# Patient Record
Sex: Male | Born: 1980 | Race: White | Hispanic: No | Marital: Single | Smoking: Never smoker
Health system: Southern US, Community
[De-identification: ages and names within clinical notes are randomized; demographics above are authoritative.]

## PROBLEM LIST (undated history)

## (undated) DIAGNOSIS — N289 Disorder of kidney and ureter, unspecified: Secondary | ICD-10-CM

---
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
## 2002-01-16 ENCOUNTER — Emergency Department (HOSPITAL_COMMUNITY): Admission: EM | Admit: 2002-01-16 | Discharge: 2002-01-16 | Payer: Self-pay | Admitting: Emergency Medicine

## 2004-05-21 ENCOUNTER — Emergency Department (HOSPITAL_COMMUNITY): Admission: EM | Admit: 2004-05-21 | Discharge: 2004-05-21 | Payer: Self-pay | Admitting: Family Medicine

## 2005-01-01 ENCOUNTER — Emergency Department (HOSPITAL_COMMUNITY): Admission: EM | Admit: 2005-01-01 | Discharge: 2005-01-01 | Payer: Self-pay | Admitting: Family Medicine

## 2005-11-11 ENCOUNTER — Emergency Department (HOSPITAL_COMMUNITY): Admission: EM | Admit: 2005-11-11 | Discharge: 2005-11-11 | Payer: Self-pay | Admitting: Family Medicine

## 2006-12-21 ENCOUNTER — Emergency Department (HOSPITAL_COMMUNITY): Admission: EM | Admit: 2006-12-21 | Discharge: 2006-12-21 | Payer: Self-pay | Admitting: Emergency Medicine

## 2007-05-27 ENCOUNTER — Emergency Department (HOSPITAL_COMMUNITY): Admission: EM | Admit: 2007-05-27 | Discharge: 2007-05-27 | Payer: Self-pay | Admitting: Emergency Medicine

## 2007-12-24 ENCOUNTER — Emergency Department (HOSPITAL_COMMUNITY): Admission: EM | Admit: 2007-12-24 | Discharge: 2007-12-24 | Payer: Self-pay | Admitting: Emergency Medicine

## 2010-12-08 LAB — CBC
HCT: 43.7
Hemoglobin: 15.2
MCHC: 34.8
RBC: 4.9
RDW: 13.2

## 2010-12-08 LAB — URINALYSIS, ROUTINE W REFLEX MICROSCOPIC
Bilirubin Urine: NEGATIVE
Ketones, ur: 15 — AB
Nitrite: NEGATIVE
Protein, ur: 30 — AB
pH: 5.5

## 2010-12-08 LAB — COMPREHENSIVE METABOLIC PANEL
ALT: 25
Alkaline Phosphatase: 81
BUN: 11
CO2: 21
Calcium: 9.6
GFR calc non Af Amer: >60
Glucose, Bld: 122 — ABNORMAL HIGH
Potassium: 4.1
Sodium: 136

## 2010-12-08 LAB — DIFFERENTIAL
Basophils Relative: 0
Eosinophils Absolute: 0
Neutro Abs: 16 — ABNORMAL HIGH
Neutrophils Relative %: 89 — ABNORMAL HIGH

## 2010-12-08 LAB — URINE MICROSCOPIC-ADD ON

## 2010-12-08 LAB — LIPASE, BLOOD: Lipase: 20

## 2010-12-15 LAB — POCT RAPID STREP A: Streptococcus, Group A Screen (Direct): NEGATIVE

## 2019-06-02 ENCOUNTER — Encounter (HOSPITAL_COMMUNITY): Payer: Self-pay

## 2019-06-02 ENCOUNTER — Emergency Department (HOSPITAL_COMMUNITY): Payer: 59

## 2019-06-02 ENCOUNTER — Other Ambulatory Visit: Payer: Self-pay

## 2019-06-02 ENCOUNTER — Emergency Department (HOSPITAL_COMMUNITY)
Admission: EM | Admit: 2019-06-02 | Discharge: 2019-06-02 | Disposition: A | Discharge status: Home / Self Care | Payer: 59 | Attending: Emergency Medicine | Admitting: Emergency Medicine

## 2019-06-02 DIAGNOSIS — N200 Calculus of kidney: Secondary | ICD-10-CM | POA: Diagnosis not present

## 2019-06-02 DIAGNOSIS — R1032 Left lower quadrant pain: Secondary | ICD-10-CM | POA: Diagnosis present

## 2019-06-02 DIAGNOSIS — M25572 Pain in left ankle and joints of left foot: Secondary | ICD-10-CM | POA: Diagnosis not present

## 2019-06-02 DIAGNOSIS — R109 Unspecified abdominal pain: Secondary | ICD-10-CM

## 2019-06-02 DIAGNOSIS — N201 Calculus of ureter: Secondary | ICD-10-CM | POA: Insufficient documentation

## 2019-06-02 DIAGNOSIS — S99919A Unspecified injury of unspecified ankle, initial encounter: Secondary | ICD-10-CM

## 2019-06-02 HISTORY — DX: Disorder of kidney and ureter, unspecified: N28.9

## 2019-06-02 LAB — CBC WITH DIFFERENTIAL/PLATELET
Abs Immature Granulocytes: 0.08 10*3/uL — ABNORMAL HIGH (ref 0.00–0.07)
Basophils Absolute: 0.1 10*3/uL (ref 0.0–0.1)
Basophils Relative: 0 %
Eosinophils Absolute: 0.1 10*3/uL (ref 0.0–0.5)
Eosinophils Relative: 0 %
HCT: 46.7 % (ref 39.0–52.0)
Hemoglobin: 15.4 g/dL (ref 13.0–17.0)
Immature Granulocytes: 1 %
Lymphocytes Relative: 14 %
Lymphs Abs: 2.5 10*3/uL (ref 0.7–4.0)
MCH: 30.4 pg (ref 26.0–34.0)
MCHC: 33 g/dL (ref 30.0–36.0)
MCV: 92.3 fL (ref 80.0–100.0)
Monocytes Absolute: 0.9 10*3/uL (ref 0.1–1.0)
Monocytes Relative: 5 %
Neutro Abs: 13.7 10*3/uL — ABNORMAL HIGH (ref 1.7–7.7)
Neutrophils Relative %: 80 %
Platelets: 279 10*3/uL (ref 150–400)
RBC: 5.06 MIL/uL (ref 4.22–5.81)
RDW: 13.6 % (ref 11.5–15.5)
WBC: 17.3 10*3/uL — ABNORMAL HIGH (ref 4.0–10.5)
nRBC: 0 % (ref 0.0–0.2)

## 2019-06-02 LAB — URINALYSIS, ROUTINE W REFLEX MICROSCOPIC
Bilirubin Urine: NEGATIVE
Glucose, UA: NEGATIVE mg/dL
Ketones, ur: 20 mg/dL — AB
Leukocytes,Ua: NEGATIVE
Nitrite: NEGATIVE
Protein, ur: NEGATIVE mg/dL
RBC / HPF: >50 RBC/hpf — ABNORMAL HIGH (ref 0–5)
Specific Gravity, Urine: 1.012 (ref 1.005–1.030)
pH: 6 (ref 5.0–8.0)

## 2019-06-02 LAB — COMPREHENSIVE METABOLIC PANEL
ALT: 39 U/L (ref 0–44)
AST: 23 U/L (ref 15–41)
Albumin: 4.3 g/dL (ref 3.5–5.0)
Alkaline Phosphatase: 89 U/L (ref 38–126)
Anion gap: 8 (ref 5–15)
BUN: 14 mg/dL (ref 6–20)
CO2: 24 mmol/L (ref 22–32)
Calcium: 9.2 mg/dL (ref 8.9–10.3)
Chloride: 104 mmol/L (ref 98–111)
Creatinine, Ser: 1.19 mg/dL (ref 0.61–1.24)
GFR calc Af Amer: >60 mL/min (ref >60)
GFR calc non Af Amer: >60 mL/min (ref >60)
Glucose, Bld: 118 mg/dL — ABNORMAL HIGH (ref 70–99)
Potassium: 3.7 mmol/L (ref 3.5–5.1)
Sodium: 136 mmol/L (ref 135–145)
Total Bilirubin: 0.8 mg/dL (ref 0.3–1.2)
Total Protein: 8.1 g/dL (ref 6.5–8.1)

## 2019-06-02 MED ORDER — TAMSULOSIN HCL 0.4 MG PO CAPS: 0.4000 mg | ORAL_CAPSULE | Freq: Every day | ORAL | 0 refills | Status: AC

## 2019-06-02 MED ORDER — ONDANSETRON HCL 4 MG/2ML IJ SOLN
4.0000 mg | Freq: Once | INTRAMUSCULAR | Status: AC
  Administered 2019-06-02: 4 mg via INTRAVENOUS
  Filled 2019-06-02: qty 2

## 2019-06-02 MED ORDER — KETOROLAC TROMETHAMINE 15 MG/ML IJ SOLN
15.0000 mg | Freq: Once | INTRAMUSCULAR | Status: AC
  Administered 2019-06-02: 15 mg via INTRAVENOUS
  Filled 2019-06-02: qty 1

## 2019-06-02 MED ORDER — MORPHINE SULFATE (PF) 4 MG/ML IV SOLN
4.0000 mg | Freq: Once | INTRAVENOUS | Status: AC
  Administered 2019-06-02: 18:00:00 4 mg via INTRAVENOUS
  Filled 2019-06-02: qty 1

## 2019-06-02 NOTE — Discharge Instructions (Addendum)
I have prescribed you a medication called tamsulosin which is better known as Flomax.  Please take this medication once a day.  This will increase your urinary frequency but can also aid in helping you pass this kidney stone.  Additionally, please take an anti-inflammatory such as ibuprofen for your pain.  You can take 800 mg every 8 hours for your pain.

## 2019-06-02 NOTE — ED Provider Notes (Signed)
[facility]-EMERGENCY DEPT Provider Note   CSN: [id number] Arrival date & time: 06/02/19  1634     History Chief Complaint  Patient presents with  . Flank Pain  . Urinary Frequency  . Ankle Pain    Randy Dennis is a 39 y.o. male.  HPI HPI Comments: Randy Dennis is a 39 y.o. male with a history of obesity who presents to the Emergency Department complaining of worsening left flank pain which began last night.  Patient states that he has a history of kidney stones with his last occurrence being about 10 years ago.  He states his current pain feels similar to prior kidney stones. He states while working in [employer] last night he began experiencing his left flank pain which has gradually worsened since last night.  He took 2 x OTC Tylenol with no significant relief of his pain.  His current pain is 10 out of 10.  Reports some mild difficulty urinating last night but denies any currently.  He also reports some mild nausea.  Denies any dysuria, hematuria and vomiting.    Past Medical History:  Diagnosis Date  . Renal disorder     There are no problems to display for this patient.   History reviewed. No pertinent surgical history.     Family History  Problem Relation Age of Onset  . Heart failure Father     Social History   Tobacco Use  . Smoking status: Never Smoker  . Smokeless tobacco: Never Used  Substance Use Topics  . Alcohol use: Never  . Drug use: Never    Home Medications Prior to Admission medications   Not on File    Allergies    Patient has no known allergies.  Review of Systems   Review of Systems  Constitutional: Negative for chills and fever.  HENT: Negative for congestion and rhinorrhea.   Respiratory: Positive for shortness of breath.   Cardiovascular: Negative for chest pain.  Gastrointestinal: Positive for abdominal pain and nausea. Negative for diarrhea and vomiting.  Genitourinary: Positive for difficulty  urinating. Negative for dysuria and hematuria.  Musculoskeletal: Positive for back pain.  All other systems reviewed and are negative.  Physical Exam Updated Vital Signs BP 136/83 (BP Location: Left Arm)   Pulse 61   Temp 98 F (36.7 C) (Oral)   Resp 18   Ht 6' (1.829 m)   Wt 115.7 kg   SpO2 98%   BMI 34.58 kg/m   Physical Exam Vitals and nursing note reviewed.  Constitutional:      General: He is in acute distress.     Appearance: Normal appearance. He is obese. He is not ill-appearing, toxic-appearing or diaphoretic.     Comments: Well developed caucasian male lying in semi fowlers position. He is in obvious pain and is diaphoretic during the exam.   HENT:     Head: Normocephalic and atraumatic.     Right Ear: External ear normal.     Left Ear: External ear normal.     Nose: Nose normal.     Mouth/Throat:     Mouth: Mucous membranes are moist.     Pharynx: No oropharyngeal exudate or posterior oropharyngeal erythema.  Eyes:     General: No scleral icterus.       Right eye: No discharge.        Left eye: No discharge.     Extraocular Movements: Extraocular movements intact.     Pupils: Pupils are  equal, round, and reactive to light.  Cardiovascular:     Rate and Rhythm: Normal rate and regular rhythm.     Pulses: Normal pulses.     Heart sounds: Normal heart sounds. No murmur. No friction rub. No gallop.   Pulmonary:     Effort: Pulmonary effort is normal. No respiratory distress.     Breath sounds: Normal breath sounds. No stridor. No wheezing, rhonchi or rales.  Abdominal:     Tenderness: There is abdominal tenderness. There is left CVA tenderness. There is no right CVA tenderness, guarding or rebound.     Comments: Mild left lateral abdominal tenderness with deep palpation  Musculoskeletal:        General: Normal range of motion.     Cervical back: Normal range of motion. No tenderness.  Skin:    General: Skin is warm and dry.  Neurological:     General: No  focal deficit present.     Mental Status: He is alert and oriented to person, place, and time.  Psychiatric:        Mood and Affect: Mood normal.        Behavior: Behavior normal.     ED Results / Procedures / Treatments   Labs (all labs ordered are listed, but only abnormal results are displayed) Labs Reviewed  URINALYSIS, ROUTINE W REFLEX MICROSCOPIC - Abnormal; Notable for the following components:      Result Value   Hgb urine dipstick LARGE (*)    Ketones, ur 20 (*)    RBC / HPF >50 (*)    Bacteria, UA RARE (*)    All other components within normal limits  COMPREHENSIVE METABOLIC PANEL - Abnormal; Notable for the following components:   Glucose, Bld 118 (*)    All other components within normal limits  CBC WITH DIFFERENTIAL/PLATELET - Abnormal; Notable for the following components:   WBC 17.3 (*)    Neutro Abs 13.7 (*)    Abs Immature Granulocytes 0.08 (*)    All other components within normal limits   EKG None  Radiology DG Ankle Complete Left  Result Date: 06/02/2019 CLINICAL DATA:  Left ankle pain after twisting injury 2 months ago. EXAM: LEFT ANKLE COMPLETE - 3+ VIEW COMPARISON:  None. FINDINGS: There is no evidence of fracture, dislocation, or joint effusion. There is no evidence of arthropathy or other focal bone abnormality. Soft tissues are unremarkable. IMPRESSION: Negative. Electronically Signed   By: Lupita Raider M.D.   On: 06/02/2019 17:29    Procedures Procedures (including critical care time)  Medications Ordered in ED Medications  morphine 4 MG/ML injection 4 mg (4 mg Intravenous Given 06/02/19 1818)  ondansetron (ZOFRAN) injection 4 mg (4 mg Intravenous Given 06/02/19 1818)  ketorolac (TORADOL) 15 MG/ML injection 15 mg (15 mg Intravenous Given 06/02/19 1944)    ED Course  I have reviewed the triage vital signs and the nursing notes.  Pertinent labs & imaging results that were available during my care of the patient were reviewed by me and  considered in my medical decision making (see chart for details).    MDM Rules/Calculators/A&P                      5:59 PM patient is a 39 year old male that presents with left flank pain. Differential includes nephrolithiasis vs dissection vs gastroenteritis vs idiopathic. Will obtain basic labs and CT renal stone study. Morphine for pain control. Zofran for nausea. Will reassess.  7:14  PM CT renal stone study shows an obstructing 78mm left distal ureteral calculus. Pt states his current pain is about 7/10. Will give IM toradol. Will reassess based on results of UA.   9:13 PM UA is reassuring.  Discussed results with patient.  Will prescribe him Flomax.  Continued NSAID use recommended for pain control.  His current pain is 1 out of 10. He understand to return with any new or worsening sx. He verbalized understanding of this plan. His questions were answered and he was amicable at the time of discharge. VSS.  Final Clinical Impression(s) / ED Diagnoses Final diagnoses:  Ankle injury  Nephrolithiasis  Flank pain    Rx / DC Orders ED Discharge Orders         Ordered    tamsulosin (FLOMAX) 0.4 MG CAPS capsule  Daily     06/02/19 2108           Rayna Sexton, PA-C 06/02/19 2117    Isla Pence, MD 06/02/19 2122

## 2019-06-02 NOTE — ED Triage Notes (Signed)
Patient c/o left flank pain, urinary frequncy since last night.  Patient also c/o left ankle pain. Patient states he rolled his left ankle a couple of weeks ago.

## 2021-07-25 IMAGING — CT CT RENAL STONE PROTOCOL
2 of 3 series · 16 of 46 positions shown, 18 images · non-contrast
Comparison: May 27, 2007

CLINICAL DATA: Left flank pain. History of kidney stones. Urinary
frequency.

EXAM:
CT ABDOMEN AND PELVIS WITHOUT CONTRAST
TECHNIQUE: Multidetector CT imaging of the abdomen and pelvis was performed
following the standard protocol without IV contrast. Automatic
exposure control utilized.

[Series 4: lung bases · axial · 0.77mm/px · z∈[+1366,+1488]mm · 13 of 71 slices shown, 15 images]
[im 5/71  soft-tissue]
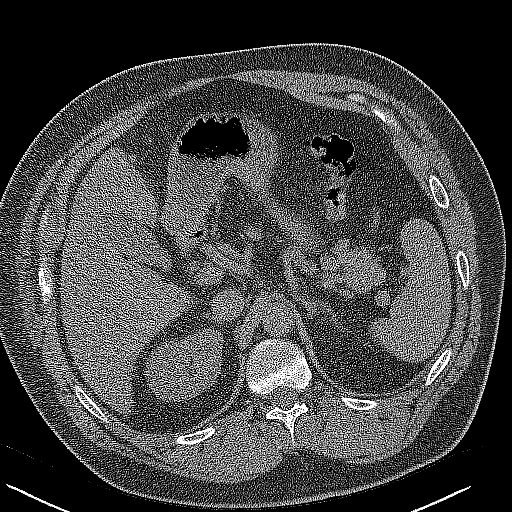
[im 5/71  bone]
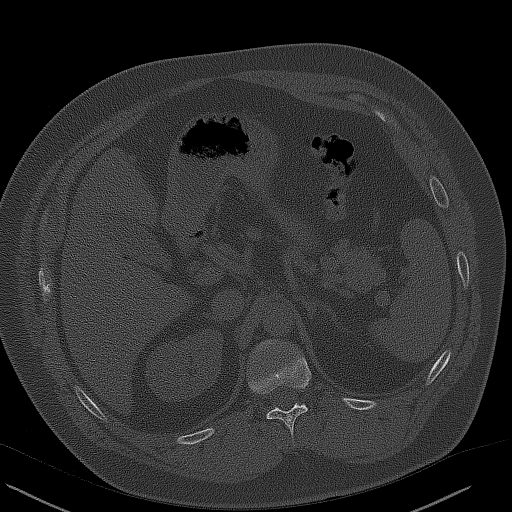
[im 10/71  soft-tissue]
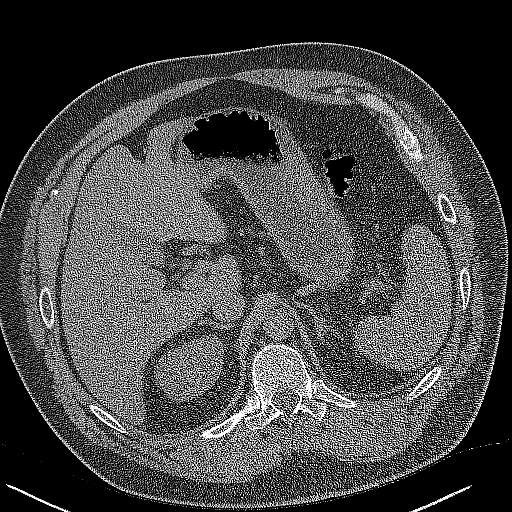
[im 14/71  soft-tissue]
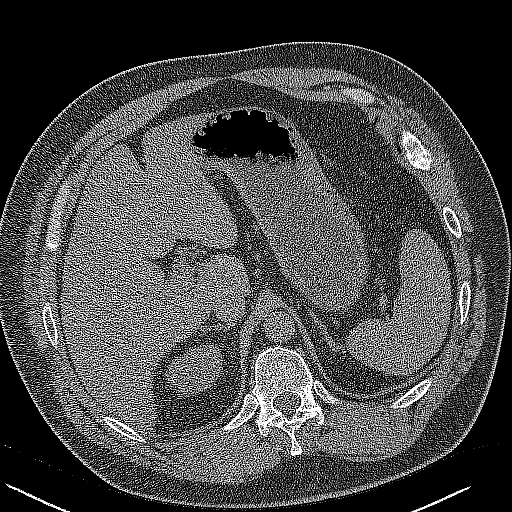
[im 21/71  soft-tissue]
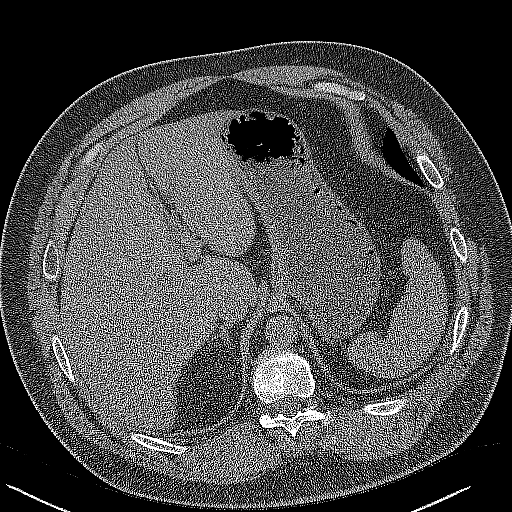
[im 25/71  soft-tissue]
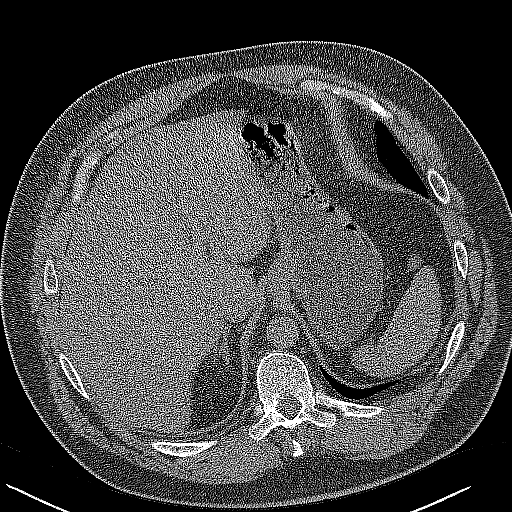
[im 30/71  soft-tissue]
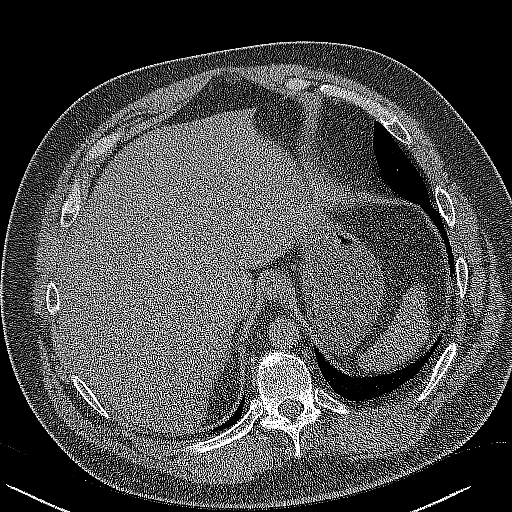
[im 37/71  soft-tissue]
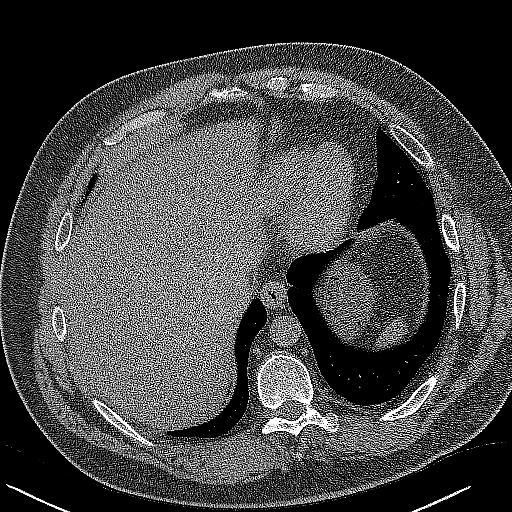
[im 41/71  soft-tissue]
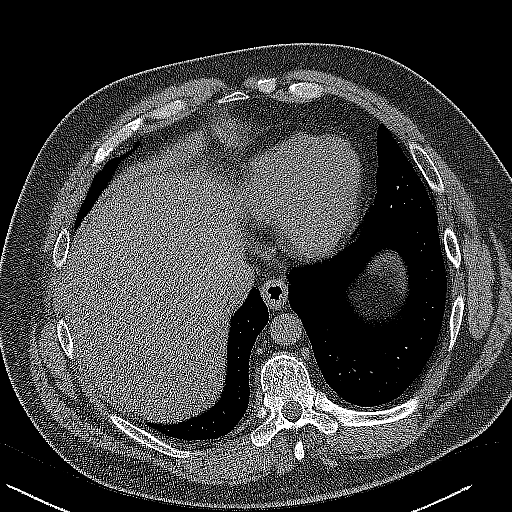
[im 46/71  soft-tissue]
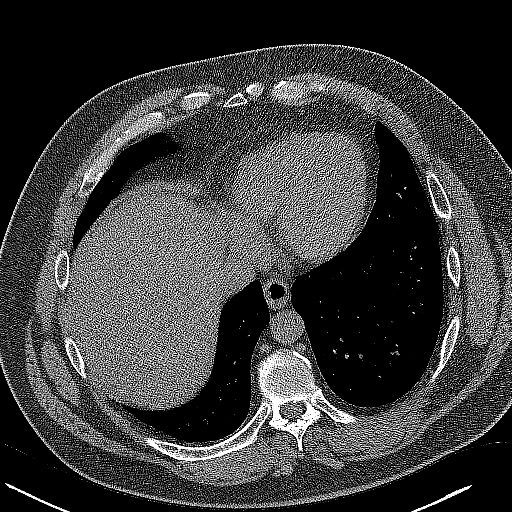
[im 46/71  bone]
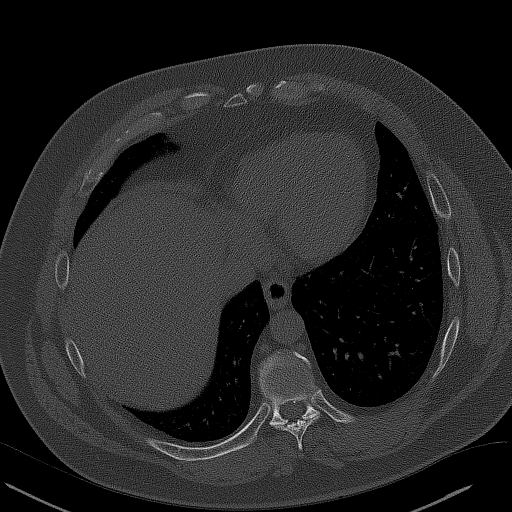
[im 50/71  soft-tissue]
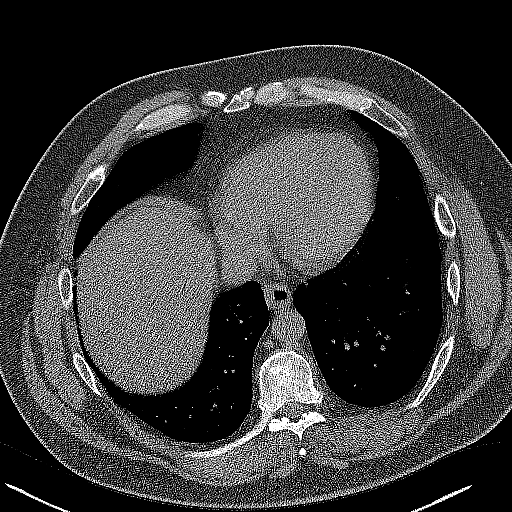
[im 57/71  soft-tissue]
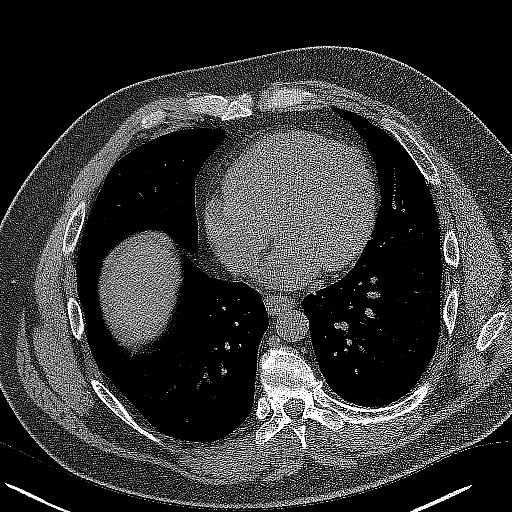
[im 61/71  soft-tissue]
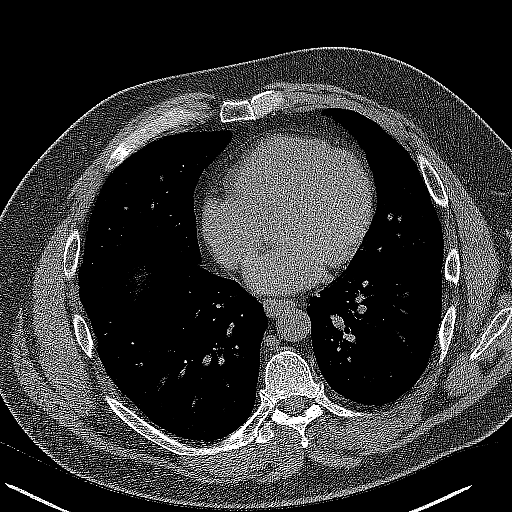
[im 66/71  soft-tissue]
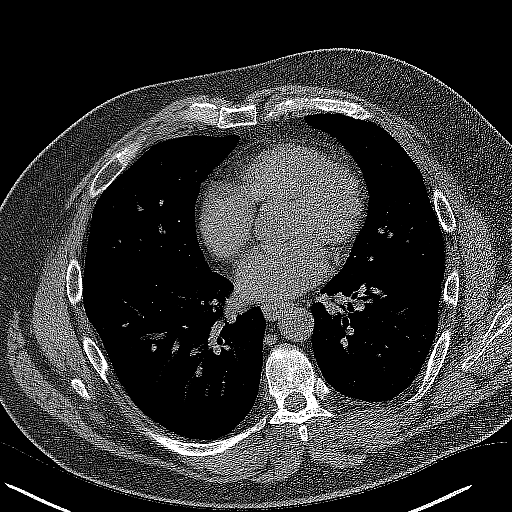

[Series 5: coronal · coronal · 0.83mm/px · 3 of 176 slices shown]
[im 59/176  soft-tissue]
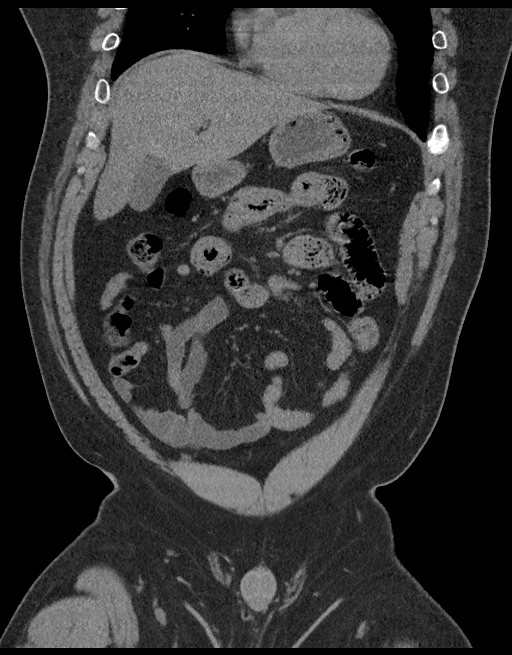
[im 78/176  soft-tissue]
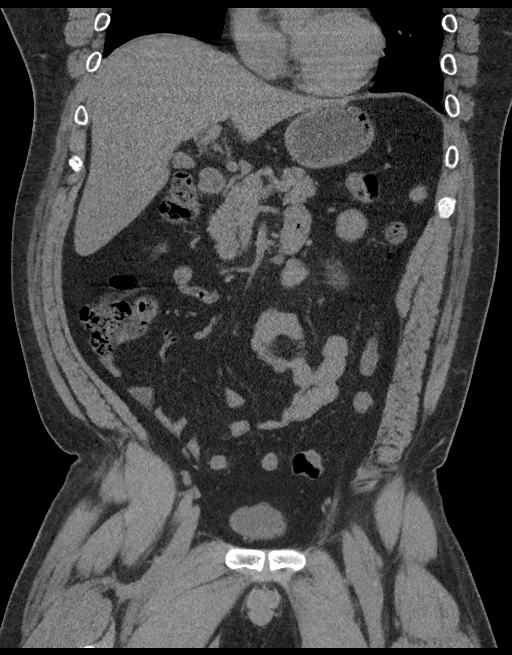
[im 98/176  soft-tissue]
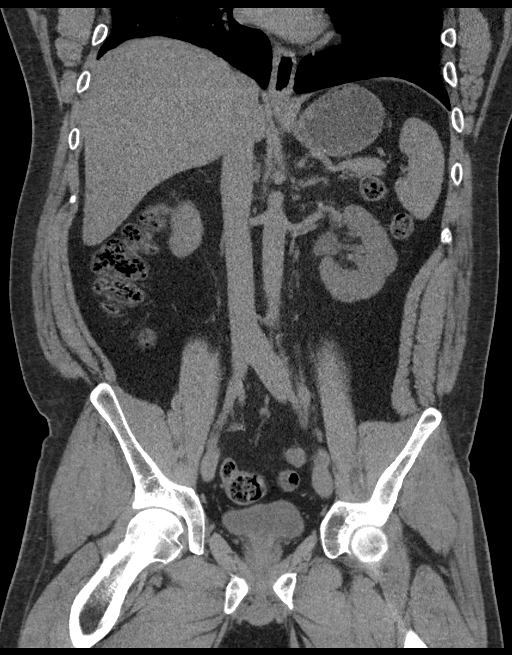

[16 of 46 positions shown; findings below may reference images not displayed]

FINDINGS: Lower chest: Normal.

Hepatobiliary: Mild hepatomegaly. Normal gallbladder and biliary
tree.

Pancreas: Normal.

Spleen: Normal.  Splenial.

Adrenals/Urinary Tract: Normal bilateral adrenal glands. Right micro
nephrolithiasis with normal right ureter. A partially obstructing 2
mm calculus in the distal left ureter approximately 2 cm from the
UVJ with left mild hydroureteronephrosis and subtle adjacent
inflammatory changes. No residual left renal calculus. Normal
urinary bladder

Stomach/Bowel: No wall thickening or obstruction. Small stool
burden. Normal appendix on axial series 2, image 58.

Vascular/Lymphatic: No adenopathy. Minimal aortic calcified
atherosclerosis without aneurysmal dilatation.

Reproductive: Normal

Other: Small right direct inguinal herniation of fat without
strangulation or incarceration.

Musculoskeletal: Transitional lumbosacral anatomy and diminutive
ribs at L1. Chronic-appearing T8 50% loss of vertebral body height
new since 6884 physiologic mild anterior wedging of T10 and T11.
IMPRESSION: An obstructing 2 mm left distal ureteral calculus with mild left
hydroureteronephrosis and adjacent subtle inflammatory changes.

Nonobstructing right micronephrolithiasis.

Small right direct inguinal herniation of fat without strangulation
or incarceration.

Mild hepatomegaly.

Aortic calcified atherosclerosis.

Interval chronic-appearing T8 50% compression fracture deformity.
